# Patient Record
Sex: Female | Born: 1995 | Race: Black or African American | Hispanic: No | Marital: Single | Smoking: Never smoker
Health system: Southern US, Community
[De-identification: ages and names within clinical notes are randomized; demographics above are authoritative.]

## PROBLEM LIST (undated history)

## (undated) DIAGNOSIS — R011 Cardiac murmur, unspecified: Secondary | ICD-10-CM

## (undated) DIAGNOSIS — N051 Unspecified nephritic syndrome with focal and segmental glomerular lesions: Secondary | ICD-10-CM

## (undated) DIAGNOSIS — I10 Essential (primary) hypertension: Secondary | ICD-10-CM

## (undated) HISTORY — DX: Cardiac murmur, unspecified: R01.1

## (undated) HISTORY — DX: Essential (primary) hypertension: I10

---
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
## 2017-07-01 DIAGNOSIS — O26832 Pregnancy related renal disease, second trimester: Secondary | ICD-10-CM | POA: Insufficient documentation

## 2017-11-11 DIAGNOSIS — D649 Anemia, unspecified: Secondary | ICD-10-CM | POA: Insufficient documentation

## 2022-04-30 ENCOUNTER — Ambulatory Visit: Payer: Medicaid Other | Admitting: Nurse Practitioner

## 2022-04-30 ENCOUNTER — Encounter: Payer: Self-pay | Admitting: Nurse Practitioner

## 2022-04-30 DIAGNOSIS — I10 Essential (primary) hypertension: Secondary | ICD-10-CM | POA: Insufficient documentation

## 2022-04-30 DIAGNOSIS — Z113 Encounter for screening for infections with a predominantly sexual mode of transmission: Secondary | ICD-10-CM

## 2022-04-30 DIAGNOSIS — N760 Acute vaginitis: Secondary | ICD-10-CM

## 2022-04-30 DIAGNOSIS — Z3200 Encounter for pregnancy test, result unknown: Secondary | ICD-10-CM

## 2022-04-30 DIAGNOSIS — R011 Cardiac murmur, unspecified: Secondary | ICD-10-CM | POA: Insufficient documentation

## 2022-04-30 DIAGNOSIS — N051 Unspecified nephritic syndrome with focal and segmental glomerular lesions: Secondary | ICD-10-CM | POA: Insufficient documentation

## 2022-04-30 LAB — PREGNANCY, URINE: Preg Test, Ur: NEGATIVE

## 2022-04-30 LAB — WET PREP FOR TRICH, YEAST, CLUE
Trichomonas Exam: NEGATIVE
Yeast Exam: NEGATIVE

## 2022-04-30 LAB — HM HEPATITIS C SCREENING LAB: HM Hepatitis Screen: NEGATIVE

## 2022-04-30 LAB — HM HIV SCREENING LAB: HM HIV Screening: NEGATIVE

## 2022-04-30 LAB — HEPATITIS B SURFACE ANTIGEN: Hepatitis B Surface Ag: NONREACTIVE

## 2022-04-30 MED ORDER — METRONIDAZOLE 500 MG PO TABS: 500.0000 mg | ORAL_TABLET | Freq: Two times a day (BID) | ORAL | 0 refills | Status: AC

## 2022-04-30 NOTE — Progress Notes (Signed)
Patient here for STD screening. Wet prep reviewed, medication dispensed per providers orders.

## 2022-04-30 NOTE — Progress Notes (Addendum)
[facility] Department  STI clinic/screening visit [address] [place] [address] [phone]  Subjective:  Carolyn Kramer is a 26 y.o. female being seen today for an STI screening visit. The patient reports they do have symptoms.  Patient reports that they do not desire a pregnancy in the next year.   They reported they are not interested in discussing contraception today.    Patient's last menstrual period was 04/14/2022 (approximate).   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 Patient wants a pregnancy test     HPI  Patient reports to clinic for STD screening.  Patient reports lower abdominal pain on and off since May and odor for one week.  Patient states in April/May she was seen in [place] and reported some abdominal discomfort, patient was given medication but unsure of name.  Patient also reported that in June she was given medication for Trich.  Last HIV test per patient/review of record was 11/02/2017. Patient reports last pap was couple years ago.   Screening for MPX risk: Does the patient have an unexplained rash? No Is the patient MSM? No Does the patient endorse multiple sex partners or anonymous sex partners? No Did the patient have close or sexual contact with a person diagnosed with MPX? No Has the patient traveled outside the [place] where MPX is endemic? No Is there a high clinical suspicion for MPX-- evidenced by one of the following No  -Unlikely to be chickenpox  -Lymphadenopathy  -Rash that present in same phase of evolution on any given body part See flowsheet for further details and programmatic requirements.   Immunization  history:   There is no immunization history on file for this patient.   The following portions of the patient's history were reviewed and updated as appropriate: allergies, current medications, past medical history, past social history, past surgical history and problem list.  Objective:  There were no vitals filed for this visit.  Physical Exam Constitutional:      Appearance: Normal appearance.  HENT:     Head: Normocephalic. No abrasion, masses or laceration. Hair is normal.     Right Ear: External ear normal.     Left Ear: External ear normal.     Nose: Nose normal.     Mouth/Throat:     Mouth: No oral lesions.     Dentition: No dental caries.     Pharynx: No oropharyngeal exudate or posterior oropharyngeal erythema.     Tonsils: No tonsillar exudate or tonsillar abscesses.  Eyes:     General: Lids are normal.        Right eye: No discharge.        Left eye: No discharge.     Conjunctiva/sclera: Conjunctivae normal.     Right eye: No exudate.    Left eye: No exudate. Abdominal:     General: Abdomen is flat.     Palpations: Abdomen is soft.     Tenderness: There is abdominal tenderness. There is no rebound.     Comments: Lower abdominal tenderness   Genitourinary:    Pubic Area: No rash or pubic lice.      Labia:  Right: No rash, tenderness, lesion or injury.        Left: No rash, tenderness, lesion or injury.      Vagina: Normal. No vaginal discharge, erythema or lesions.     Cervix: No cervical motion tenderness, discharge, lesion or erythema.     Uterus: Not enlarged and not tender.      Rectum: Normal.     Comments: Amount Discharge: small  Odor: No pH: greater than 4.5 Adheres to vaginal wall: No Color: color of discharge matches the Modine Oppenheimer swab  Musculoskeletal:     Cervical back: Full passive range of motion without pain, normal range of motion and neck supple.  Lymphadenopathy:     Cervical: No cervical adenopathy.     Right cervical: No  superficial, deep or posterior cervical adenopathy.    Left cervical: No superficial, deep or posterior cervical adenopathy.     Upper Body:     Right upper body: No supraclavicular, axillary or epitrochlear adenopathy.     Left upper body: No supraclavicular, axillary or epitrochlear adenopathy.     Lower Body: No right inguinal adenopathy. No left inguinal adenopathy.  Skin:    General: Skin is warm and dry.     Findings: No lesion or rash.  Neurological:     Mental Status: She is alert and oriented to person, place, and time.  Psychiatric:        Attention and Perception: Attention normal.        Mood and Affect: Mood normal.        Speech: Speech normal.        Behavior: Behavior normal. Behavior is cooperative.      Assessment and Plan:  [name] is a 26 y.o. female presenting to the [facility] Department for STI screening   1. Screening examination for venereal disease -26 year old female in clinic today for STD screening. -Patient given Kathreen Cosier Card.   -Patient accepted all screenings including oral, vaginal, rectal CT/GC, wet prep, and bloodwork for HIV/RPR.  Patient meets criteria for HepB screening? Yes. Ordered? Yes Patient meets criteria for HepC screening? Yes. Ordered? Yes  Treat wet prep per standing order Discussed time line for [facility] results and that patient will be called with positive results and encouraged patient to call if she had not heard in 2 weeks.  Counseled to return or seek care for continued or worsening symptoms Recommended condom use with all sex  Patient is currently not using  contraception  to prevent pregnancy.    - HIV/HCV [place] [facility] - Syphilis Serology, [place] [facility] - HBV Antigen/Antibody [facility] - Chlamydia/Gonorrhea [place] [facility] - Chlamydia/Gonorrhea [place] [facility] - Chlamydia/Gonorrhea [place] [facility] - WET PREP FOR TRICH, YEAST, CLUE  2. Pregnancy examination or test, pregnancy unconfirmed -PT  negative today.  - Pregnancy, urine  3. Bacterial vaginosis -Wet prep reviewed, please treat patient for BV.  - metroNIDAZOLE (FLAGYL) 500 MG tablet; Take 1 tablet (500 mg total) by mouth 2 (two) times daily for 7 days.  Dispense: 14 tablet; Refill: 0   Total time spent: 30 minutes   Return if symptoms worsen or fail to improve.   Glenna Fellows, FNP

## 2022-06-04 ENCOUNTER — Ambulatory Visit: Payer: Medicaid Other

## 2022-06-11 ENCOUNTER — Ambulatory Visit
Admission: EM | Admit: 2022-06-11 | Discharge: 2022-06-11 | Disposition: A | Discharge status: Home / Self Care | Payer: Medicaid Other | Attending: Family Medicine | Admitting: Family Medicine

## 2022-06-11 ENCOUNTER — Encounter: Payer: Self-pay | Admitting: Emergency Medicine

## 2022-06-11 DIAGNOSIS — B9689 Other specified bacterial agents as the cause of diseases classified elsewhere: Secondary | ICD-10-CM | POA: Insufficient documentation

## 2022-06-11 DIAGNOSIS — N760 Acute vaginitis: Secondary | ICD-10-CM | POA: Insufficient documentation

## 2022-06-11 MED ORDER — METRONIDAZOLE 500 MG PO TABS: 500.0000 mg | ORAL_TABLET | Freq: Two times a day (BID) | ORAL | 0 refills | Status: DC

## 2022-06-11 NOTE — ED Triage Notes (Signed)
Vaginal odor and itching x 1 week.

## 2022-06-11 NOTE — ED Provider Notes (Signed)
[facility]    CSN: [id number] Arrival date & time: 06/11/22  1707      History   Chief Complaint No chief complaint on file.   HPI Carolyn Kramer is a 26 y.o. female.   Patient presenting today with about a month of vaginal odor, discharge.  Had some itching initially as well but that seems to have resolved.  Was diagnosed with BV about a month ago, states she was given metronidazole but she was not taking it appropriately and missed numerous doses.  Does not feel like it ever cleared up.  Denies any new sexual partners, abdominal pain, urinary symptoms, rashes or lesions.  Not trying anything over-the-counter for symptoms.    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      Home Medications    Prior to Admission medications   Medication Sig Start Date End Date Taking? Authorizing Provider  metroNIDAZOLE (FLAGYL) 500 MG tablet Take 1 tablet (500 mg total) by mouth 2 (two) times daily. 06/11/22  Yes Particia Nearing, PA-C    Family History History reviewed. No pertinent family history.  Social History Social History   Tobacco Use   Smoking status: Never   Smokeless tobacco: Never  Vaping Use   Vaping Use: Never used  Substance Use Topics   Alcohol use: Yes    Comment: occassionally   Drug use: Yes    Types: Marijuana    Comment: daily     Allergies   Patient has no known allergies.   Review of Systems Review of Systems Per HPI  Physical Exam Triage Vital Signs ED Triage Vitals  Enc Vitals Group     BP 06/11/22 1733 127/79     Pulse Rate 06/11/22 1733 76      Resp 06/11/22 1733 18     Temp 06/11/22 1733 98.3 F (36.8 C)     Temp Source 06/11/22 1733 Oral     SpO2 06/11/22 1733 98 %     Weight --      Height --      Head Circumference --      Peak Flow --      Pain Score 06/11/22 1734 0     Pain Loc --      Pain Edu? --      Excl. in GC? --    No data found.  Updated Vital Signs BP 127/79 (BP Location: Right Arm)   Pulse 76   Temp 98.3 F (36.8 C) (Oral)   Resp 18   LMP 05/28/2022 (Approximate)   SpO2 98%   Visual Acuity Right Eye Distance:   Left Eye Distance:   Bilateral Distance:    Right Eye Near:   Left Eye Near:    Bilateral Near:     Physical Exam Vitals and nursing note reviewed.  Constitutional:      Appearance: Normal appearance. She is not ill-appearing.  HENT:     Head: Atraumatic.  Eyes:     Extraocular Movements: Extraocular movements intact.     Conjunctiva/sclera: Conjunctivae normal.  Cardiovascular:  Rate and Rhythm: Normal rate and regular rhythm.     Heart sounds: Normal heart sounds.  Pulmonary:     Effort: Pulmonary effort is normal.     Breath sounds: Normal breath sounds.  Genitourinary:    Comments: GU exam deferred, self swab performed Musculoskeletal:        General: Normal range of motion.     Cervical back: Normal range of motion and neck supple.  Skin:    General: Skin is warm and dry.  Neurological:     Mental Status: She is alert and oriented to person, place, and time.  Psychiatric:        Mood and Affect: Mood normal.        Thought Content: Thought content normal.        Judgment: Judgment normal.      UC Treatments / Results  Labs (all labs ordered are listed, but only abnormal results are displayed) Labs Reviewed  CERVICOVAGINAL ANCILLARY ONLY    EKG   Radiology No results found.  Procedures Procedures (including critical care time)  Medications Ordered in UC Medications - No data to display  Initial Impression / Assessment and Plan / UC Course  I  have reviewed the triage vital signs and the nursing notes.  Pertinent labs & imaging results that were available during my care of the patient were reviewed by me and considered in my medical decision making (see chart for details).     We will prescribe another round of metronidazole and discussed consistency with taking the medication.  Also discussed some supportive over-the-counter medications and remedies to help support a healthy pH.  Follow-up with OB/GYN for unresolving symptoms.  Vaginal swab pending, adjust if needed.  Final Clinical Impressions(s) / UC Diagnoses   Final diagnoses:  BV (bacterial vaginosis)     Discharge Instructions      You may try boric acid vaginal suppositories, vaginal health probiotics to help support a healthy vaginal flora.  Avoid any scented soaps, feminine washes or wipes, douching as these things may throw off your pH    ED Prescriptions     Medication Sig Dispense Auth. Provider   metroNIDAZOLE (FLAGYL) 500 MG tablet Take 1 tablet (500 mg total) by mouth 2 (two) times daily. 14 tablet Particia Nearing, [place]      PDMP not reviewed this encounter.   Particia Nearing, [place] 06/11/22 1759

## 2022-06-11 NOTE — Discharge Instructions (Signed)
You may try boric acid vaginal suppositories, vaginal health probiotics to help support a healthy vaginal flora.  Avoid any scented soaps, feminine washes or wipes, douching as these things may throw off your pH

## 2022-06-13 LAB — CERVICOVAGINAL ANCILLARY ONLY
Bacterial Vaginitis (gardnerella): POSITIVE — AB
Candida Glabrata: NEGATIVE
Candida Vaginitis: NEGATIVE
Chlamydia: NEGATIVE
Comment: NEGATIVE
Comment: NEGATIVE
Comment: NEGATIVE
Comment: NEGATIVE
Comment: NEGATIVE
Comment: NORMAL
Neisseria Gonorrhea: NEGATIVE
Trichomonas: NEGATIVE

## 2022-06-19 ENCOUNTER — Ambulatory Visit: Payer: Medicaid Other

## 2022-12-10 ENCOUNTER — Emergency Department (HOSPITAL_BASED_OUTPATIENT_CLINIC_OR_DEPARTMENT_OTHER): Payer: Medicaid Other

## 2022-12-10 ENCOUNTER — Other Ambulatory Visit: Payer: Self-pay

## 2022-12-10 ENCOUNTER — Emergency Department (HOSPITAL_COMMUNITY): Payer: Medicaid Other

## 2022-12-10 ENCOUNTER — Encounter (HOSPITAL_BASED_OUTPATIENT_CLINIC_OR_DEPARTMENT_OTHER): Payer: Self-pay | Admitting: Emergency Medicine

## 2022-12-10 ENCOUNTER — Emergency Department (HOSPITAL_BASED_OUTPATIENT_CLINIC_OR_DEPARTMENT_OTHER)
Admission: EM | Admit: 2022-12-10 | Discharge: 2022-12-11 | Disposition: A | Discharge status: Home / Self Care | Payer: Medicaid Other | Attending: Emergency Medicine | Admitting: Emergency Medicine

## 2022-12-10 DIAGNOSIS — M542 Cervicalgia: Secondary | ICD-10-CM | POA: Insufficient documentation

## 2022-12-10 DIAGNOSIS — I10 Essential (primary) hypertension: Secondary | ICD-10-CM | POA: Diagnosis not present

## 2022-12-10 DIAGNOSIS — R531 Weakness: Secondary | ICD-10-CM | POA: Diagnosis not present

## 2022-12-10 DIAGNOSIS — R202 Paresthesia of skin: Secondary | ICD-10-CM | POA: Diagnosis not present

## 2022-12-10 DIAGNOSIS — R200 Anesthesia of skin: Secondary | ICD-10-CM | POA: Diagnosis present

## 2022-12-10 LAB — CBC
HCT: 36.3 % (ref 36.0–46.0)
Hemoglobin: 11.9 g/dL — ABNORMAL LOW (ref 12.0–15.0)
MCH: 27.6 pg (ref 26.0–34.0)
MCHC: 32.8 g/dL (ref 30.0–36.0)
MCV: 84.2 fL (ref 80.0–100.0)
Platelets: 396 10*3/uL (ref 150–400)
RBC: 4.31 MIL/uL (ref 3.87–5.11)
RDW: 14.6 % (ref 11.5–15.5)
WBC: 5.7 10*3/uL (ref 4.0–10.5)
nRBC: 0 % (ref 0.0–0.2)

## 2022-12-10 LAB — BASIC METABOLIC PANEL
Anion gap: 7 (ref 5–15)
BUN: 17 mg/dL (ref 6–20)
CO2: 26 mmol/L (ref 22–32)
Calcium: 9.6 mg/dL (ref 8.9–10.3)
Chloride: 105 mmol/L (ref 98–111)
Creatinine, Ser: 0.85 mg/dL (ref 0.44–1.00)
GFR, Estimated: >60 mL/min (ref >60)
Glucose, Bld: 86 mg/dL (ref 70–99)
Potassium: 3.8 mmol/L (ref 3.5–5.1)
Sodium: 138 mmol/L (ref 135–145)

## 2022-12-10 LAB — PREGNANCY, URINE: Preg Test, Ur: NEGATIVE

## 2022-12-10 MED ORDER — GADOBUTROL 1 MMOL/ML IV SOLN
7.5000 mL | Freq: Once | INTRAVENOUS | Status: AC | PRN
  Administered 2022-12-10: 7.5 mL via INTRAVENOUS

## 2022-12-10 NOTE — ED Provider Notes (Signed)
[facility] Provider Note   CSN: [id number] Arrival date & time: 12/10/22  1344     History  Chief Complaint  Patient presents with   Extremity Weakness    Elynore Micheal Likens Parks is a 27 y.o. female.   Extremity Weakness  Patient presents with left arm numbness.  Has had some pain in the past in the forearm but now numb and weakness.  States that began earlier today.  Also some neck pain.  Works at [employer].  No trauma.  No headache.  No confusion.  States her sister has MS.  Arm does not feel cold.  No chest pain.  States it feels like she slept on it    Past Medical History:  Diagnosis Date   FSGS (focal segmental glomerulosclerosis)    Heart murmur    Hypertension     Home Medications Prior to Admission medications   Medication Sig Start Date End Date Taking? Authorizing Provider  metroNIDAZOLE (FLAGYL) 500 MG tablet Take 1 tablet (500 mg total) by mouth 2 (two) times daily. 06/11/22   Volney American, PA-C      Allergies    Patient has no known allergies.    Review of Systems   Review of Systems  Musculoskeletal:  Positive for extremity weakness.    Physical Exam Updated Vital Signs BP 129/88 (BP Location: Right Arm)   Pulse 65   Temp 98.3 F (36.8 C)   Resp 18   Ht '5\' 6"'$  (1.676 m)   Wt 81.6 kg   LMP 12/01/2022 (Approximate)   SpO2 100%   BMI 29.05 kg/m  Physical Exam Vitals and nursing note reviewed.  Eyes:     Pupils: Pupils are equal, round, and reactive to light.  Cardiovascular:     Rate and Rhythm: Regular rhythm.  Abdominal:     Tenderness: There is no abdominal tenderness.  Neurological:     Mental Status: She is alert.     Comments: Strength intact bilateral upper extremities.  Good grip strength.  Paresthesias to left forearm down to hand.  Over glove distribution.  Pulse intact.     ED Results / Procedures / Treatments   Labs (all labs ordered are listed, but only abnormal results  are displayed) Labs Reviewed  BASIC METABOLIC PANEL  CBC  PREGNANCY, URINE    EKG None  Radiology No results found.  Procedures Procedures    Medications Ordered in ED Medications - No data to display  ED Course/ Medical Decision Making/ A&P                             Medical Decision Making Amount and/or Complexity of Data Reviewed Labs: ordered. Radiology: ordered.   Patient with paresthesias left forearm.  States starting today however had numbness and decreased use of it.  Walking down the hall was carrying the paperwork in her right hand instead of her left hand and she is left-handed.  Has had left forearm pain.  Potentially could be an overuse injury, however discussed with Dr. Quinn Axe from neurology.  Recommends MRI with and without contrast of head and neck to rule out MS.  Will get CT of the cervical spine here first.  Will check basic blood work.  Care turned over to Dr. Pearline Cables.          Final Clinical Impression(s) / ED Diagnoses Final diagnoses:  Paresthesia    Rx / DC  Orders ED Discharge Orders     None         Davonna Belling, MD 12/10/22 1512

## 2022-12-10 NOTE — ED Triage Notes (Signed)
Pt via pov from home with left arm numbness today. She reports her arm felt normal until 0930, when she began to feel pressure and then numbness/tingling. Pt has full ROM with the arm. She states that this has been going on for some time, but she isn't exactly sure how long. Pt states the pressure/pain has been for about 2 years, but the numbness is "fairly new." Pt has some drift in her right arm as well as reduced sensation.  Pt alert & oriented, nad noted.

## 2022-12-10 NOTE — ED Provider Notes (Signed)
  Provider Note MRN:  [id number]  Arrival date & time: 12/10/22    ED Course and Medical Decision Making  Assumed care from Dr Alvino Chapel at shift change.  See note from prior team for complete details, in brief:   27 yo f, plan [facility].  Family hx MS Left forearm pain/neck pain.  Numbness/paresthesias to forearm.  Labs stable CT c-spine was stable Prior team spoke with Dr Quinn Axe > recommend MRI head/neck w/wo; if neg o/p neuro fu  Will send over to [facility] ED for MRI brain/c-spine  Accepting Dr Oswald Hillock at [facility] ED   Procedures  Final Clinical Impressions(s) / ED Diagnoses     ICD-10-CM   1. Paresthesia  R20.2       ED Discharge Orders     None       Discharge Instructions   None        Oluwatosin, Bracy, DO 12/10/22 1639

## 2022-12-10 NOTE — ED Notes (Signed)
Report given to the Charge RN at [facility].Marland KitchenMarland Kitchen

## 2022-12-10 NOTE — ED Notes (Signed)
Pt released to drive POV, Provider authorized, to [facility] for a MRI... Pt explain on where and how to arrive at [facility]... IV secured.Marland KitchenMarland Kitchen

## 2022-12-10 NOTE — Discharge Instructions (Signed)
It was a pleasure caring for you today in the emergency department. ° °Please return to the emergency department for any worsening or worrisome symptoms. ° ° °

## 2022-12-10 NOTE — ED Notes (Signed)
Pt CAOx4, no new symptoms... No change in complaint.Marland KitchenMarland Kitchen

## 2022-12-10 NOTE — ED Notes (Signed)
Pt informed this Probation officer she was sent over from [place] for a MRI.  This Probation officer confirmed the MRIs have been ordered.  Pt informed they will be able to take her to MRI from lobby.

## 2022-12-11 NOTE — ED Provider Notes (Signed)
Patient transferred for MRI.  Patient with paresthesias and family history of MS.  She arrived to [facility] to have MRI of brain and cervical spine with and without contrast.  Imaging has been performed.  Nonspecific findings present, no evidence of demyelinating disease or other acute pathology to explain patient's symptoms.  As per plan, discharge with neurology outpatient follow-up.   Orpah Greek, MD 12/11/22 [phone]

## 2023-02-04 ENCOUNTER — Ambulatory Visit: Payer: Medicaid Other | Admitting: Advanced Practice Midwife

## 2023-02-04 ENCOUNTER — Encounter: Payer: Self-pay | Admitting: Advanced Practice Midwife

## 2023-02-04 DIAGNOSIS — Z62810 Personal history of physical and sexual abuse in childhood: Secondary | ICD-10-CM

## 2023-02-04 DIAGNOSIS — F1290 Cannabis use, unspecified, uncomplicated: Secondary | ICD-10-CM

## 2023-02-04 DIAGNOSIS — Z113 Encounter for screening for infections with a predominantly sexual mode of transmission: Secondary | ICD-10-CM | POA: Diagnosis not present

## 2023-02-04 DIAGNOSIS — F4310 Post-traumatic stress disorder, unspecified: Secondary | ICD-10-CM

## 2023-02-04 LAB — HM HIV SCREENING LAB: HM HIV Screening: NEGATIVE

## 2023-02-04 LAB — WET PREP FOR TRICH, YEAST, CLUE
Trichomonas Exam: NEGATIVE
Yeast Exam: NEGATIVE

## 2023-02-04 LAB — HM HEPATITIS C SCREENING LAB: HM Hepatitis Screen: NEGATIVE

## 2023-02-04 NOTE — Progress Notes (Signed)
Reviewed in house labs during visit. BTHIELE RN

## 2023-02-04 NOTE — Progress Notes (Signed)
[facility]  STI clinic/screening visit [address] [place] [address] [phone]  Subjective:  Carolyn Kramer is a 27 y.o. SBF exvaper G1P1  female being seen today for an STI screening visit. The patient reports they do not have symptoms.  Patient reports that they do not desire a pregnancy in the next year.   They reported they are not interested in discussing contraception today.    Patient's last menstrual period was 01/19/2023 (approximate).  Patient has the following medical conditions:   Patient Active Problem List   Diagnosis Date Noted   Morbid obesity (HCC) 180 lbs 02/04/2023   Marijuana use 02/04/2023   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   Hypertension 04/30/2022   Heart murmur 04/30/2022    Chief Complaint  Patient presents with   STI Visit    HPI  Patient reports asymptomatic. LMP 01/26/23. Last sex 01/30/23 without condom; first time with this partner; 1 partner in last 3 mo. Last MJ today. Last vaped 2022. Last cigar age 10. Last ETOH 12/11/22 (7 shots Tequila). Last pap 2023 wnl  Does the patient using douching products? No  Last HIV test per patient/review of record was  Lab Results  Component Value Date   HMHIVSCREEN Negative - Validated 04/30/2022   No results found for: "HIV" Patient reports last pap was No results found for: "DIAGPAP" No results found for: "SPECADGYN"  Screening for MPX risk: Does the patient have an unexplained rash? No Is the patient MSM? No Does the patient endorse multiple sex partners or anonymous sex partners? No Did the patient have close or sexual contact with a person diagnosed with MPX? No Has the patient traveled outside the [place] where MPX is endemic? No Is there a high clinical suspicion for MPX-- evidenced by one of the following No  -Unlikely to be chickenpox  -Lymphadenopathy  -Rash that present in same phase of evolution on any given body part See  flowsheet for further details and programmatic requirements.   Immunization history:  Immunization History  Administered Date(s) Administered   HPV 9-valent 06/21/2015, 05/14/2016   Hep B, Unspecified [birth year] 1998, 12/05/1997, 12/04/1999   MMR 12/04/1999, 05/05/2003   Meningococcal Conjugate 06/21/2015   Tdap 06/20/2009, 11/02/2017     The following portions of the patient's history were reviewed and updated as appropriate: allergies, current medications, past medical history, past social history, past surgical history and problem list.  Objective:  There were no vitals filed for this visit.  Physical Exam Vitals and nursing note reviewed.  Constitutional:      Appearance: Normal appearance. She is obese.  HENT:     Head: Normocephalic and atraumatic.     Mouth/Throat:     Mouth: Mucous membranes are moist.     Pharynx: Oropharynx is clear. No oropharyngeal exudate or posterior oropharyngeal erythema.  Eyes:     Conjunctiva/sclera: Conjunctivae normal.  Pulmonary:     Effort: Pulmonary effort is normal.  Abdominal:     Palpations: Abdomen is soft. There is no mass.     Tenderness: There is no abdominal tenderness. There is no rebound.     Comments: Soft without masses or tenderness  Genitourinary:    General: Normal vulva.     Exam position: Lithotomy position.     Pubic Area: No rash or pubic lice.      Labia:        Right: No rash or lesion.        Left: No  rash or lesion.      Vagina: Vaginal discharge (white creamy leukorrhea, ph<4.5) present. No erythema, bleeding or lesions.     Cervix: Normal.     Uterus: Normal.      Adnexa: Right adnexa normal and left adnexa normal.     Rectum: Normal.     Comments: pH = <4.5 Lymphadenopathy:     Head:     Right side of head: No preauricular or posterior auricular adenopathy.     Left side of head: No preauricular or posterior auricular adenopathy.     Cervical: No cervical adenopathy.     Right cervical: No superficial,  deep or posterior cervical adenopathy.    Left cervical: No superficial, deep or posterior cervical adenopathy.     Upper Body:     Right upper body: No supraclavicular, axillary or epitrochlear adenopathy.     Left upper body: No supraclavicular, axillary or epitrochlear adenopathy.     Lower Body: No right inguinal adenopathy. No left inguinal adenopathy.  Skin:    General: Skin is warm and dry.     Findings: No rash.  Neurological:     Mental Status: She is alert and oriented to person, place, and time.      Assessment and Plan:  Carolyn Kramer is a 27 y.o. female presenting to the [facility] Department for STI screening  1. Screening examination for venereal disease Treat wet mount per standing orders Immunization nurse consult  - WET PREP FOR TRICH, YEAST, CLUE - Syphilis Serology, [facility] - HIV/HCV [facility] - Chlamydia/Gonorrhea [facility] - Gonococcus culture  2. Morbid obesity (HCC) 180 lbs   3. Marijuana use    Patient accepted all screenings including oral, vaginal CT/GC and bloodwork for HIV/RPR, and wet prep. Patient meets criteria for HepB screening? Yes. Ordered? no Patient meets criteria for HepC screening? Yes. Ordered? yes  Treat wet prep per standing order Discussed time line for [facility] results and that patient will be called with positive results and encouraged patient to call if she had not heard in 2 weeks.  Counseled to return or seek care for continued or worsening symptoms Recommended repeat testing in 3 months with positive results. Recommended condom use with all sex  Patient is currently using  nothing  to prevent pregnancy.    Return if symptoms worsen or fail to improve.  Future Appointments  Date Time Provider Department Center  02/05/2023  2:00 PM Suanne Marker, MD [facility] None  02/27/2023 10:00 AM Kara Dies, NP [id number]    Carolyn Kramer, Carolyn Kramer

## 2023-02-05 ENCOUNTER — Encounter: Payer: Self-pay | Admitting: Diagnostic Neuroimaging

## 2023-02-05 ENCOUNTER — Ambulatory Visit (INDEPENDENT_AMBULATORY_CARE_PROVIDER_SITE_OTHER): Payer: Medicaid Other | Admitting: Diagnostic Neuroimaging

## 2023-02-05 VITALS — BP 112/66 | HR 94 | Ht 65.0 in | Wt 185.0 lb

## 2023-02-05 DIAGNOSIS — M79602 Pain in left arm: Secondary | ICD-10-CM

## 2023-02-05 DIAGNOSIS — M542 Cervicalgia: Secondary | ICD-10-CM | POA: Diagnosis not present

## 2023-02-05 NOTE — Patient Instructions (Signed)
  PAIN LEFT ARM (since ~2021; mild fluctuating numbness in left arm since March 2024; neuro exam normal; likely musculoskeletal strain) - refer to PT for conservative mgmt

## 2023-02-05 NOTE — Progress Notes (Signed)
[facility]  PATIENT: Carolyn Kramer DOB: 1996-01-05  REFERRING CLINICIAN: Gilda Crease,* HISTORY FROM: patient  REASON FOR VISIT: new consult   HISTORICAL  CHIEF COMPLAINT:  Chief Complaint  Patient presents with   Numbness    Rm 6 alone  Pt is well, reports she has been having pens and needles feeling in L arm since 12/10/22. She previously was having pain and numbness since 2021.  She is also having less mobility in L arm than she has in R.     HISTORY OF PRESENT ILLNESS:   27 year old female here for evaluation of numbness and tingling and pain.  Left upper extremity pain since 2021 radiating from left neck and shoulder to the left arm.  In March 2024 had more numbness and tingling sensation in left arm.  She was emergency room for evaluation.  MRI of the brain and cervical spine obtained which showed no acute findings or any explanation for symptoms.  Since that time symptoms have slightly improved.  No problems with right arm.  No problems with legs.  No facial numbness.  No vision changes speech or swallowing issues.   REVIEW OF SYSTEMS: Full 14 system review of systems performed and negative with exception of: as per HPI.  ALLERGIES: No Known Allergies  HOME MEDICATIONS: No outpatient medications prior to visit.   No facility-administered medications prior to visit.    PAST MEDICAL HISTORY: Past Medical History:  Diagnosis Date   FSGS (focal segmental glomerulosclerosis)    Heart murmur    Hypertension     PAST SURGICAL HISTORY: Past Surgical History:  Procedure Laterality Date   Kidney biopsy   2013    FAMILY HISTORY: History reviewed. No pertinent family history.  SOCIAL HISTORY: Social History   Socioeconomic History   Marital status: Single    Spouse name: Not on file   Number of children: Not on file   Years of education: Not on file   Highest education level: Not on file  Occupational History   Not on  file  Tobacco Use   Smoking status: Some Days    Types: E-cigarettes   Smokeless tobacco: Never  Vaping Use   Vaping Use: Never used  Substance and Sexual Activity   Alcohol use: Yes    Alcohol/week: 7.0 standard drinks of alcohol    Types: 7 Shots of liquor per week    Comment: last use 12/11/22 qomonth   Drug use: Yes    Types: Marijuana    Comment: daily   Sexual activity: Yes    Partners: Male    Birth control/protection: Condom  Other Topics Concern   Not on file  Social History Narrative   Not on file   Social Determinants of Health   Financial Resource Strain: Not on file  Food Insecurity: Not on file  Transportation Needs: Not on file  Physical Activity: Not on file  Stress: Not on file  Social Connections: Not on file  Intimate Partner Violence: Not At Risk (04/30/2022)   Humiliation, Afraid, Rape, and Kick questionnaire    Fear of Current or Ex-Partner: No    Emotionally Abused: No    Physically Abused: No    Sexually Abused: No     PHYSICAL EXAM  GENERAL EXAM/CONSTITUTIONAL: Vitals:  Vitals:   02/05/23 1349  BP: 112/66  Pulse: 94  Weight: 185 lb (83.9 kg)  Height: 5\' 5"  (1.651 m)   Body mass index is 30.79 kg/m. Wt Readings from Last  3 Encounters:  02/05/23 185 lb (83.9 kg)  12/10/22 180 lb (81.6 kg)  02/26/16 163 lb (73.9 kg) (91 %, Z= 1.32)*   * Growth percentiles are based on CDC (Girls, 2-20 Years) data.   Patient is in no distress; well developed, nourished and groomed; neck is supple  CARDIOVASCULAR: Examination of carotid arteries is normal; no carotid bruits Regular rate and rhythm, no murmurs Examination of peripheral vascular system by observation and palpation is normal  EYES: Ophthalmoscopic exam of optic discs and posterior segments is normal; no papilledema or hemorrhages No results found.  MUSCULOSKELETAL: Gait, strength, tone, movements noted in Neurologic exam below  NEUROLOGIC: MENTAL STATUS:      No data to  display         awake, alert, oriented to person, place and time recent and remote memory intact normal attention and concentration language fluent, comprehension intact, naming intact fund of knowledge appropriate  CRANIAL NERVE:  2nd - no papilledema on fundoscopic exam 2nd, 3rd, 4th, 6th - pupils equal and reactive to light, visual fields full to confrontation, extraocular muscles intact, no nystagmus 5th - facial sensation symmetric 7th - facial strength symmetric 8th - hearing intact 9th - palate elevates symmetrically, uvula midline 11th - shoulder shrug symmetric 12th - tongue protrusion midline  MOTOR:  normal bulk and tone, full strength in the BUE, BLE  SENSORY:  normal and symmetric to light touch, temperature, vibration  COORDINATION:  finger-nose-finger, fine finger movements normal  REFLEXES:  deep tendon reflexes 1+ and symmetric  GAIT/STATION:  narrow based gait     DIAGNOSTIC DATA (LABS, IMAGING, TESTING) - I reviewed patient records, labs, notes, testing and imaging myself where available.  Lab Results  Component Value Date   WBC 5.7 12/10/2022   HGB 11.9 (L) 12/10/2022   HCT 36.3 12/10/2022   MCV 84.2 12/10/2022   PLT 396 12/10/2022      Component Value Date/Time   NA 138 12/10/2022 1530   NA 142 (H) 06/02/2014 0032   K 3.8 12/10/2022 1530   K 3.8 06/02/2014 0032   CL 105 12/10/2022 1530   CL 106 06/02/2014 0032   CO2 26 12/10/2022 1530   CO2 27 (H) 06/02/2014 0032   GLUCOSE 86 12/10/2022 1530   GLUCOSE 102 (H) 06/02/2014 0032   BUN 17 12/10/2022 1530   BUN 12 06/02/2014 0032   CREATININE 0.85 12/10/2022 1530   CREATININE 0.93 06/02/2014 0032   CALCIUM 9.6 12/10/2022 1530   CALCIUM 9.2 06/02/2014 0032   PROT 7.7 02/26/2016 2254   PROT 8.2 06/02/2014 0032   ALBUMIN 4.0 02/26/2016 2254   ALBUMIN 3.9 06/02/2014 0032   AST 18 02/26/2016 2254   AST 34 (H) 06/02/2014 0032   ALT 10 (L) 02/26/2016 2254   ALT 18 06/02/2014 0032    ALKPHOS 54 02/26/2016 2254   ALKPHOS 73 06/02/2014 0032   BILITOT 0.1 (L) 02/26/2016 2254   BILITOT 0.4 06/02/2014 0032   GFRNONAA >60 12/10/2022 1530   GFRAA >60 02/26/2016 2254   No results found for: "CHOL", "HDL", "LDLCALC", "LDLDIRECT", "TRIG", "CHOLHDL" No results found for: "HGBA1C" No results found for: "VITAMINB12" No results found for: "TSH"   12/10/22 MRI brain / cervical spine (with and without) [I reviewed images myself and agree with interpretation. -VRP]  1. Bilateral, frontal predominant, multifocal hyperintense T2-weighted signal in the white matter, as may be seen in patients with migraines, but is also commonly present in asymptomatic patients. 2. Otherwise normal brain and  cervical spine.    ASSESSMENT AND PLAN  27 y.o. year old female here with:   Dx:  1. Neck pain   2. Pain of left upper extremity      PLAN:  PAIN LEFT ARM (since ~2021; mild fluctuating numbness in left arm since March 2024; neuro exam normal; likely musculoskeletal strain) - refer to PT for conservative mgmt - consider sports medicine evaluation or EMG/NCS if symptoms worsen or fail to improve  Orders Placed This Encounter  Procedures   Ambulatory referral to Physical Therapy   Return for return to PCP, pending if symptoms worsen or fail to improve.    Suanne Marker, MD 02/05/2023, 2:17 PM Certified in Neurology, Neurophysiology and Neuroimaging  [facility] [address], [address] [place], [place] [address] [phone]

## 2023-02-08 LAB — GONOCOCCUS CULTURE

## 2023-02-27 ENCOUNTER — Ambulatory Visit: Payer: Medicaid Other | Admitting: Nurse Practitioner

## 2023-03-13 ENCOUNTER — Ambulatory Visit: Payer: Medicaid Other

## 2023-03-16 ENCOUNTER — Ambulatory Visit: Payer: Medicaid Other | Attending: Diagnostic Neuroimaging

## 2023-03-16 DIAGNOSIS — M79602 Pain in left arm: Secondary | ICD-10-CM | POA: Insufficient documentation

## 2023-03-16 DIAGNOSIS — R293 Abnormal posture: Secondary | ICD-10-CM | POA: Insufficient documentation

## 2023-03-16 DIAGNOSIS — M542 Cervicalgia: Secondary | ICD-10-CM | POA: Diagnosis present

## 2023-03-16 DIAGNOSIS — R202 Paresthesia of skin: Secondary | ICD-10-CM | POA: Diagnosis present
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
## 2023-03-18 ENCOUNTER — Ambulatory Visit: Payer: Medicaid Other

## 2023-03-18 DIAGNOSIS — M542 Cervicalgia: Secondary | ICD-10-CM | POA: Diagnosis not present

## 2023-03-18 DIAGNOSIS — R202 Paresthesia of skin: Secondary | ICD-10-CM

## 2023-03-18 DIAGNOSIS — R293 Abnormal posture: Secondary | ICD-10-CM
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
## 2023-03-25 ENCOUNTER — Ambulatory Visit: Payer: Medicaid Other

## 2023-03-25 DIAGNOSIS — M542 Cervicalgia: Secondary | ICD-10-CM

## 2023-03-25 DIAGNOSIS — R293 Abnormal posture: Secondary | ICD-10-CM

## 2023-03-25 DIAGNOSIS — R202 Paresthesia of skin: Secondary | ICD-10-CM
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
## 2023-04-15 ENCOUNTER — Ambulatory Visit: Payer: Medicaid Other | Attending: Diagnostic Neuroimaging

## 2023-04-15 DIAGNOSIS — M542 Cervicalgia: Secondary | ICD-10-CM | POA: Insufficient documentation

## 2023-04-15 DIAGNOSIS — R202 Paresthesia of skin: Secondary | ICD-10-CM | POA: Insufficient documentation

## 2023-04-15 DIAGNOSIS — R293 Abnormal posture: Secondary | ICD-10-CM | POA: Diagnosis present
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
## 2023-04-22 ENCOUNTER — Ambulatory Visit: Payer: Medicaid Other

## 2023-04-22 ENCOUNTER — Telehealth: Payer: Self-pay

## 2023-04-22 NOTE — Telephone Encounter (Signed)
Called patient about missed PT appointment this morning but no answer. LVM regarding time and date of next appointment.

## 2023-04-24 ENCOUNTER — Ambulatory Visit: Payer: Medicaid Other

## 2023-04-24 DIAGNOSIS — M542 Cervicalgia: Secondary | ICD-10-CM

## 2023-04-24 DIAGNOSIS — R202 Paresthesia of skin: Secondary | ICD-10-CM

## 2023-04-24 DIAGNOSIS — R293 Abnormal posture: Secondary | ICD-10-CM
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
## 2023-04-29 ENCOUNTER — Ambulatory Visit: Payer: Medicaid Other

## 2023-05-01 ENCOUNTER — Ambulatory Visit: Payer: Medicaid Other | Attending: Diagnostic Neuroimaging

## 2023-05-01 DIAGNOSIS — M542 Cervicalgia: Secondary | ICD-10-CM | POA: Diagnosis present

## 2023-05-01 DIAGNOSIS — R202 Paresthesia of skin: Secondary | ICD-10-CM | POA: Insufficient documentation

## 2023-05-01 DIAGNOSIS — R293 Abnormal posture: Secondary | ICD-10-CM | POA: Diagnosis present
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
## 2023-05-04 ENCOUNTER — Ambulatory Visit: Payer: Medicaid Other

## 2023-05-04 DIAGNOSIS — M542 Cervicalgia: Secondary | ICD-10-CM

## 2023-05-04 DIAGNOSIS — R202 Paresthesia of skin: Secondary | ICD-10-CM

## 2023-05-04 DIAGNOSIS — R293 Abnormal posture: Secondary | ICD-10-CM
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
## 2023-05-07 ENCOUNTER — Ambulatory Visit: Payer: Medicaid Other

## 2023-05-13 ENCOUNTER — Encounter: Payer: Self-pay | Admitting: Family Medicine

## 2023-05-13 ENCOUNTER — Ambulatory Visit (INDEPENDENT_AMBULATORY_CARE_PROVIDER_SITE_OTHER): Payer: Medicaid Other | Admitting: Family Medicine

## 2023-05-13 VITALS — BP 108/70 | HR 79 | Temp 98.2°F | Resp 18 | Ht 66.0 in | Wt 185.5 lb

## 2023-05-13 DIAGNOSIS — Z7689 Persons encountering health services in other specified circumstances: Secondary | ICD-10-CM | POA: Diagnosis not present

## 2023-05-13 DIAGNOSIS — N912 Amenorrhea, unspecified: Secondary | ICD-10-CM | POA: Diagnosis not present

## 2023-05-13 DIAGNOSIS — Z331 Pregnant state, incidental: Secondary | ICD-10-CM

## 2023-05-13 LAB — POCT URINE PREGNANCY: Preg Test, Ur: POSITIVE — AB

## 2023-05-13 NOTE — Progress Notes (Signed)
New Patient Office Visit  Subjective    Patient ID: Carolyn Kramer, female    DOB: January 11, 1996  Age: 27 y.o. MRN: [id number]  CC:  Chief Complaint  Patient presents with   Establish Care    Patient is here to establish care with a new PCP, she states that her last LMP was 04/16/23, Patient states that she took a home pregnancy test and the test was positive    HPI Carolyn Kramer presents to establish care. Pt is new to me.   She is here for confirmation of pregnancy. Here with potential father. She reports LMP was 7/18 and was regular. She says she took a home pregnancy test on Sunday and it was positive.  She has 1 daughter age 48. She has hx of preeclampsia and delivered in [place]. She would like to have her referral sent to [facility] for [facility] care.          No outpatient encounter medications on file as of 05/13/2023.   No facility-administered encounter medications on file as of 05/13/2023.    Past Medical History:  Diagnosis Date   FSGS (focal segmental glomerulosclerosis)    Heart murmur    Hypertension     Past Surgical History:  Procedure Laterality Date   Kidney biopsy   2013    Family History  Problem Relation Age of Onset   Cancer Mother        remission   Diabetes Father     Social History   Socioeconomic History   Marital status: Single    Spouse name: Not on file   Number of children: Not on file   Years of education: Not on file   Highest education level: GED or equivalent  Occupational History   Not on file  Tobacco Use   Smoking status: Never    Passive exposure: Current   Smokeless tobacco: Never  Vaping Use   Vaping status: Never Used  Substance and Sexual Activity   Alcohol use: Yes    Alcohol/week: 7.0 standard drinks of alcohol    Types: 7 Shots of liquor per week    Comment: socially   Drug use: Yes    Types: Marijuana    Comment: daily   Sexual activity: Yes    Partners: Male    Birth control/protection:  Condom  Other Topics Concern   Not on file  Social History Narrative   Not on file   Social Determinants of Health   Financial Resource Strain: Low Risk  (05/12/2023)   Overall Financial Resource Strain (CARDIA)    Difficulty of Paying Living Expenses: Not hard at all  Food Insecurity: No Food Insecurity (05/12/2023)   Hunger Vital Sign    Worried About Running Out of Food in the Last Year: Never true    Ran Out of Food in the Last Year: Never true  Transportation Needs: No Transportation Needs (05/12/2023)   PRAPARE - Administrator, Civil Service (Medical): No    Lack of Transportation (Non-Medical): No  Physical Activity: Sufficiently Active (05/12/2023)   Exercise Vital Sign    Days of Exercise per Week: 4 days    Minutes of Exercise per Session: 60 min  Stress: No Stress Concern Present (05/12/2023)   [employer] of Occupational Health - Occupational Stress Questionnaire    Feeling of Stress : Not at all  Social Connections: Unknown (05/12/2023)   Social Connection and Isolation Panel [NHANES]    Frequency  of Communication with Friends and Family: More than three times a week    Frequency of Social Gatherings with Friends and Family: More than three times a week    Attends Religious Services: Patient declined    Database administrator or Organizations: No    Attends Banker Meetings: Not on file    Marital Status: Patient declined  Intimate Partner Violence: Not At Risk (04/30/2022)   Humiliation, Afraid, Rape, and Kick questionnaire    Fear of Current or Ex-Partner: No    Emotionally Abused: No    Physically Abused: No    Sexually Abused: No    Review of Systems  All other systems reviewed and are negative.       Objective    BP 108/70   Pulse 79   Temp 98.2 F (36.8 C) (Oral)   Resp 18   Ht 5\' 6"  (1.676 m)   Wt 185 lb 8 oz (84.1 kg)   LMP 04/16/2023 (Exact Date)   SpO2 100%   BMI 29.94 kg/m   Physical Exam Vitals and nursing  note reviewed.  HENT:     Head: Normocephalic and atraumatic.     Right Ear: External ear normal.     Left Ear: External ear normal.     Nose: Nose normal.     Mouth/Throat:     Mouth: Mucous membranes are moist.  Cardiovascular:     Rate and Rhythm: Normal rate.  Pulmonary:     Effort: Pulmonary effort is normal.  Neurological:     General: No focal deficit present.     Mental Status: She is oriented to person, place, and time. Mental status is at baseline.  Psychiatric:        Mood and Affect: Mood normal.        Behavior: Behavior normal.        Thought Content: Thought content normal.        Judgment: Judgment normal.        Assessment & Plan:   Problem List Items Addressed This Visit   None Visit Diagnoses     Encounter to establish care with new doctor    -  Primary   Amenorrhea       Relevant Orders   POCT urine pregnancy (Completed)   hCG, serum, qualitative   Ambulatory referral to Obstetrics / Gynecology   IUP (intrauterine pregnancy), incidental       Relevant Orders   hCG, serum, qualitative   Ambulatory referral to Obstetrics / Gynecology     Positive pregnancy test. Will follow up with serum HCG per request. Referral sent to [facility] per pt's request.   Return if symptoms worsen or fail to improve.   Suzan Slick, MD

## 2023-05-14 LAB — HCG, SERUM, QUALITATIVE: hCG,Beta Subunit,Qual,Serum: POSITIVE m[IU]/mL — AB (ref <6)

## 2023-05-15 ENCOUNTER — Ambulatory Visit: Payer: Medicaid Other

## 2023-05-15 DIAGNOSIS — M542 Cervicalgia: Secondary | ICD-10-CM | POA: Diagnosis not present

## 2023-05-15 DIAGNOSIS — R293 Abnormal posture: Secondary | ICD-10-CM

## 2023-05-15 DIAGNOSIS — R202 Paresthesia of skin: Secondary | ICD-10-CM

## 2023-05-15 LAB — SPECIMEN STATUS REPORT

## 2023-05-15 LAB — BETA HCG QUANT (REF LAB): hCG Quant: 148 m[IU]/mL
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
## 2023-05-19 ENCOUNTER — Ambulatory Visit: Payer: Medicaid Other

## 2023-05-22 ENCOUNTER — Ambulatory Visit: Payer: Medicaid Other

## 2023-06-15 ENCOUNTER — Ambulatory Visit: Payer: Medicaid Other | Attending: Diagnostic Neuroimaging

## 2023-06-15 DIAGNOSIS — R202 Paresthesia of skin: Secondary | ICD-10-CM | POA: Diagnosis present

## 2023-06-15 DIAGNOSIS — M542 Cervicalgia: Secondary | ICD-10-CM | POA: Diagnosis present

## 2023-06-15 DIAGNOSIS — R293 Abnormal posture: Secondary | ICD-10-CM | POA: Insufficient documentation
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
## 2023-06-17 ENCOUNTER — Telehealth: Payer: Self-pay

## 2023-06-17 NOTE — Telephone Encounter (Signed)
Called pt to discuss insurance approving only 1 additional visit. Pt verbalized understanding and agreed that her next appt on 9/25 will be her final appt. An updated HEP will be provided.

## 2023-06-18 ENCOUNTER — Ambulatory Visit: Payer: Medicaid Other

## 2023-06-24 ENCOUNTER — Telehealth: Payer: Self-pay

## 2023-06-24 ENCOUNTER — Ambulatory Visit: Payer: Medicaid Other

## 2023-06-24 NOTE — Telephone Encounter (Signed)
Called pt about missed appt today. LVM regarding next visit 10/02 at 9:45am.

## 2023-07-01 ENCOUNTER — Ambulatory Visit: Payer: Medicaid Other | Attending: Diagnostic Neuroimaging

## 2023-07-07 ENCOUNTER — Ambulatory Visit: Payer: Medicaid Other

## 2023-09-10 ENCOUNTER — Other Ambulatory Visit: Payer: Self-pay | Admitting: Family Medicine

## 2023-09-10 ENCOUNTER — Ambulatory Visit (INDEPENDENT_AMBULATORY_CARE_PROVIDER_SITE_OTHER): Payer: Medicaid Other | Admitting: Family Medicine

## 2023-09-10 ENCOUNTER — Encounter: Payer: Self-pay | Admitting: Family Medicine

## 2023-09-10 VITALS — BP 126/82 | HR 72 | Temp 98.8°F | Resp 16 | Ht 66.0 in | Wt 193.0 lb

## 2023-09-10 DIAGNOSIS — Z118 Encounter for screening for other infectious and parasitic diseases: Secondary | ICD-10-CM | POA: Diagnosis not present

## 2023-09-10 DIAGNOSIS — Z7251 High risk heterosexual behavior: Secondary | ICD-10-CM

## 2023-09-10 DIAGNOSIS — Z202 Contact with and (suspected) exposure to infections with a predominantly sexual mode of transmission: Secondary | ICD-10-CM

## 2023-09-10 DIAGNOSIS — Z1159 Encounter for screening for other viral diseases: Secondary | ICD-10-CM

## 2023-09-10 MED ORDER — AZITHROMYCIN 500 MG PO TABS: 1000.0000 mg | ORAL_TABLET | Freq: Once | ORAL | 0 refills | Status: AC

## 2023-09-10 NOTE — Progress Notes (Signed)
   Acute Office Visit  Subjective:     Patient ID: Carolyn Kramer, female    DOB: Mar 31, 1996, 27 y.o.   MRN: [id number]  Chief Complaint  Patient presents with   Medical Management of Chronic Issues    Std testing, chlamydia treatment, new partner    HPI Patient is in today for acute visit.  Pt is here due to exposure to chlamydia with sexual partner. She would like to to be screened for this along with all other STD screenings today.  Pt reports she has no symptoms.   Review of Systems  All other systems reviewed and are negative.      Objective:    BP 126/82   Pulse 72   Temp 98.8 F (37.1 C) (Oral)   Resp 16   Ht 5\' 6"  (1.676 m)   Wt 193 lb (87.5 kg)   SpO2 100%   BMI 31.15 kg/m  BP Readings from Last 3 Encounters:  09/10/23 126/82  05/13/23 108/70  02/05/23 112/66      Physical Exam Vitals and nursing note reviewed.  Constitutional:      Appearance: Normal appearance. She is normal weight.  HENT:     Head: Normocephalic and atraumatic.     Right Ear: External ear normal.     Left Ear: External ear normal.     Nose: Nose normal.     Mouth/Throat:     Mouth: Mucous membranes are moist.     Pharynx: Oropharynx is clear.  Eyes:     Conjunctiva/sclera: Conjunctivae normal.     Pupils: Pupils are equal, round, and reactive to light.  Cardiovascular:     Rate and Rhythm: Normal rate.  Pulmonary:     Effort: Pulmonary effort is normal.  Skin:    General: Skin is warm.     Capillary Refill: Capillary refill takes less than 2 seconds.  Neurological:     General: No focal deficit present.     Mental Status: She is alert and oriented to person, place, and time. Mental status is at baseline.  Psychiatric:        Mood and Affect: Mood normal.        Behavior: Behavior normal.        Thought Content: Thought content normal.        Judgment: Judgment normal.   No results found for any visits on 09/10/23.      Assessment & Plan:   Problem List  Items Addressed This Visit   None  Exposure to chlamydia  High risk heterosexual behavior -     Chlamydia/Gonococcus/Trichomonas, NAA -     Azithromycin; Take 2 tablets (1,000 mg total) by mouth once for 1 dose.  Dispense: 2 tablet; Refill: 0 -     HepB+HepC+HIV Panel -     RPR  Special screening examination for viral and chlamydial disease -     Chlamydia/Gonococcus/Trichomonas, NAA -     Azithromycin; Take 2 tablets (1,000 mg total) by mouth once for 1 dose.  Dispense: 2 tablet; Refill: 0   Exposure to chlamydia. Will do screening along with empirically treat with Azithromycin 1 gm x 1 dose. To follow up on results.  Counseled on safer sex practices. No orders of the defined types were placed in this encounter.   No follow-ups on file.  Suzan Slick, MD

## 2023-09-11 LAB — RPR: RPR Ser Ql: NONREACTIVE

## 2023-09-11 LAB — HEPB+HEPC+HIV PANEL
HIV Screen 4th Generation wRfx: NONREACTIVE
Hep B C IgM: NEGATIVE
Hep B Core Total Ab: NEGATIVE
Hep B E Ab: NONREACTIVE
Hep B E Ag: NEGATIVE
Hep B Surface Ab, Qual: NONREACTIVE
Hep C Virus Ab: NONREACTIVE
Hepatitis B Surface Ag: NEGATIVE

## 2023-09-12 ENCOUNTER — Encounter: Payer: Self-pay | Admitting: Family Medicine

## 2023-09-13 LAB — CHLAMYDIA/GONOCOCCUS/TRICHOMONAS, NAA
Chlamydia by NAA: NEGATIVE
Gonococcus by NAA: NEGATIVE
Trich vag by NAA: NEGATIVE

## 2023-12-10 ENCOUNTER — Encounter: Payer: Self-pay | Admitting: Family Medicine

## 2023-12-10 ENCOUNTER — Ambulatory Visit (INDEPENDENT_AMBULATORY_CARE_PROVIDER_SITE_OTHER): Payer: Medicaid Other | Admitting: Family Medicine

## 2023-12-10 VITALS — BP 133/84 | HR 75 | Temp 98.1°F | Resp 18 | Ht 66.0 in | Wt 194.0 lb

## 2023-12-10 DIAGNOSIS — R7302 Impaired glucose tolerance (oral): Secondary | ICD-10-CM

## 2023-12-10 DIAGNOSIS — Z13220 Encounter for screening for lipoid disorders: Secondary | ICD-10-CM | POA: Diagnosis not present

## 2023-12-10 DIAGNOSIS — Z118 Encounter for screening for other infectious and parasitic diseases: Secondary | ICD-10-CM

## 2023-12-10 DIAGNOSIS — Z0000 Encounter for general adult medical examination without abnormal findings: Secondary | ICD-10-CM | POA: Diagnosis not present

## 2023-12-10 DIAGNOSIS — Z124 Encounter for screening for malignant neoplasm of cervix: Secondary | ICD-10-CM

## 2023-12-10 DIAGNOSIS — Z1159 Encounter for screening for other viral diseases: Secondary | ICD-10-CM

## 2023-12-10 DIAGNOSIS — Z136 Encounter for screening for cardiovascular disorders: Secondary | ICD-10-CM
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
## 2023-12-11 ENCOUNTER — Encounter: Payer: Self-pay | Admitting: Family Medicine

## 2023-12-11 LAB — HEMOGLOBIN A1C
Est. average glucose Bld gHb Est-mCnc: 117 mg/dL
Hgb A1c MFr Bld: 5.7 % — ABNORMAL HIGH (ref 4.8–5.6)

## 2023-12-11 LAB — COMPREHENSIVE METABOLIC PANEL
ALT: 16 IU/L (ref 0–32)
AST: 17 IU/L (ref 0–40)
Albumin: 4.4 g/dL (ref 4.0–5.0)
Alkaline Phosphatase: 64 IU/L (ref 44–121)
BUN/Creatinine Ratio: 13 (ref 9–23)
BUN: 12 mg/dL (ref 6–20)
Bilirubin Total: 0.3 mg/dL (ref 0.0–1.2)
CO2: 21 mmol/L (ref 20–29)
Calcium: 9.1 mg/dL (ref 8.7–10.2)
Chloride: 104 mmol/L (ref 96–106)
Creatinine, Ser: 0.91 mg/dL (ref 0.57–1.00)
Globulin, Total: 2.7 g/dL (ref 1.5–4.5)
Glucose: 92 mg/dL (ref 70–99)
Potassium: 4.4 mmol/L (ref 3.5–5.2)
Sodium: 138 mmol/L (ref 134–144)
Total Protein: 7.1 g/dL (ref 6.0–8.5)
eGFR: 89 mL/min/{1.73_m2} (ref >59)

## 2023-12-11 LAB — LIPID PANEL
Chol/HDL Ratio: 2.8 ratio (ref 0.0–4.4)
Cholesterol, Total: 167 mg/dL (ref 100–199)
HDL: 60 mg/dL (ref >39)
LDL Chol Calc (NIH): 99 mg/dL (ref 0–99)
Triglycerides: 36 mg/dL (ref 0–149)
VLDL Cholesterol Cal: 8 mg/dL (ref 5–40)

## 2023-12-11 LAB — CBC WITH DIFFERENTIAL/PLATELET
Basophils Absolute: 0.1 10*3/uL (ref 0.0–0.2)
Basos: 1 %
EOS (ABSOLUTE): 0.1 10*3/uL (ref 0.0–0.4)
Eos: 2 %
Hematocrit: 38.4 % (ref 34.0–46.6)
Hemoglobin: 12.2 g/dL (ref 11.1–15.9)
Immature Grans (Abs): 0 10*3/uL (ref 0.0–0.1)
Immature Granulocytes: 0 %
Lymphocytes Absolute: 1.9 10*3/uL (ref 0.7–3.1)
Lymphs: 33 %
MCH: 27.8 pg (ref 26.6–33.0)
MCHC: 31.8 g/dL (ref 31.5–35.7)
MCV: 88 fL (ref 79–97)
Monocytes Absolute: 0.5 10*3/uL (ref 0.1–0.9)
Monocytes: 8 %
Neutrophils Absolute: 3.2 10*3/uL (ref 1.4–7.0)
Neutrophils: 56 %
Platelets: 365 10*3/uL (ref 150–450)
RBC: 4.39 x10E6/uL (ref 3.77–5.28)
RDW: 14.1 % (ref 11.7–15.4)
WBC: 5.8 10*3/uL (ref 3.4–10.8)

## 2023-12-23 ENCOUNTER — Encounter: Payer: Self-pay | Admitting: Family Medicine

## 2023-12-23 LAB — PAP IG AND CT-NG NAA
Chlamydia, Nuc. Acid Amp: NEGATIVE
Gonococcus by Nucleic Acid Amp: NEGATIVE
PAP Smear Comment: 0

## 2024-03-16 ENCOUNTER — Ambulatory Visit: Admitting: Family Medicine

## 2024-03-21 ENCOUNTER — Encounter: Payer: Self-pay | Admitting: Family Medicine

## 2024-03-21 ENCOUNTER — Ambulatory Visit (INDEPENDENT_AMBULATORY_CARE_PROVIDER_SITE_OTHER): Admitting: Family Medicine

## 2024-03-21 VITALS — BP 120/70 | HR 90 | Temp 99.2°F | Resp 18 | Ht 66.0 in | Wt 177.4 lb

## 2024-03-21 DIAGNOSIS — N898 Other specified noninflammatory disorders of vagina: Secondary | ICD-10-CM

## 2024-03-21 DIAGNOSIS — Z118 Encounter for screening for other infectious and parasitic diseases: Secondary | ICD-10-CM

## 2024-03-21 DIAGNOSIS — Z1159 Encounter for screening for other viral diseases: Secondary | ICD-10-CM | POA: Diagnosis not present

## 2024-03-21 NOTE — Progress Notes (Signed)
   Established Patient Office Visit  Subjective   Patient ID: Joeleen Wortley, female    DOB: Nov 13, 1995  Age: 28 y.o. MRN: [id number]  Chief Complaint  Patient presents with   Exposure to STD    Patient is here for STD screening , Patient states that she was not exposed     Exposure to STD   STD screening Pt reported she noticed a bump on her upper labia. She made this appt because of this. It's now gone. She is sexually active.   Review of Systems  Genitourinary:        Vaginal bump that is now resolved  All other systems reviewed and are negative.    Objective:     BP 120/70   Pulse 90   Temp 99.2 F (37.3 C) (Oral)   Resp 18   Ht 5' 6 (1.676 m)   Wt 177 lb 6.4 oz (80.5 kg)   LMP  (Exact Date)   SpO2 98%   BMI 28.63 kg/m  BP Readings from Last 3 Encounters:  03/21/24 120/70  12/10/23 133/84  09/10/23 126/82      Physical Exam Vitals and nursing note reviewed.  Constitutional:      Appearance: Normal appearance. She is normal weight.  HENT:     Head: Normocephalic and atraumatic.     Right Ear: External ear normal.     Left Ear: External ear normal.     Nose: Nose normal.     Mouth/Throat:     Mouth: Mucous membranes are moist.     Pharynx: Oropharynx is clear.   Eyes:     Conjunctiva/sclera: Conjunctivae normal.     Pupils: Pupils are equal, round, and reactive to light.    Cardiovascular:     Rate and Rhythm: Normal rate.  Pulmonary:     Effort: Pulmonary effort is normal.  Abdominal:     General: Bowel sounds are normal.   Skin:    General: Skin is warm.     Capillary Refill: Capillary refill takes less than 2 seconds.   Neurological:     General: No focal deficit present.     Mental Status: She is alert and oriented to person, place, and time. Mental status is at baseline.   Psychiatric:        Mood and Affect: Mood normal.        Behavior: Behavior normal.        Thought Content: Thought content normal.        Judgment:  Judgment normal.    No results found for any visits on 03/21/24.     The ASCVD Risk score (Arnett DK, et al., 2019) failed to calculate for the following reasons:   The 2019 ASCVD risk score is only valid for ages 66 to 68    Assessment & Plan:   Problem List Items Addressed This Visit   None  Vaginal lesion  Special screening examination for viral and chlamydial disease -     Acute Viral Hepatitis (HAV, HBV, HCV) -     HIV Antibody (routine testing w rflx) -     RPR -     Chlamydia/Gonococcus/Trichomonas, NAA   Has hx of vaginal lesion that is now gone. To send for screening STD check with urine and serum.  No follow-ups on file.    Torrence CINDERELLA Barrier, MD

## 2024-03-22 ENCOUNTER — Ambulatory Visit: Payer: Self-pay | Admitting: Family Medicine

## 2024-03-22 LAB — ACUTE VIRAL HEPATITIS (HAV, HBV, HCV)
HCV Ab: NONREACTIVE
Hep A IgM: NEGATIVE
Hep B C IgM: NEGATIVE
Hepatitis B Surface Ag: NEGATIVE

## 2024-03-22 LAB — HIV ANTIBODY (ROUTINE TESTING W REFLEX): HIV Screen 4th Generation wRfx: NONREACTIVE

## 2024-03-22 LAB — RPR: RPR Ser Ql: NONREACTIVE

## 2024-03-22 LAB — HCV INTERPRETATION

## 2024-03-23 LAB — CHLAMYDIA/GONOCOCCUS/TRICHOMONAS, NAA
Chlamydia by NAA: NEGATIVE
Gonococcus by NAA: NEGATIVE
Trich vag by NAA: NEGATIVE

## 2024-05-24 ENCOUNTER — Ambulatory Visit (INDEPENDENT_AMBULATORY_CARE_PROVIDER_SITE_OTHER): Admitting: Family Medicine

## 2024-05-24 ENCOUNTER — Encounter: Payer: Self-pay | Admitting: Family Medicine

## 2024-05-24 VITALS — BP 124/77 | HR 69 | Temp 98.2°F | Resp 18 | Ht 66.0 in | Wt 169.2 lb

## 2024-05-24 DIAGNOSIS — J039 Acute tonsillitis, unspecified: Secondary | ICD-10-CM | POA: Diagnosis not present

## 2024-05-24 MED ORDER — PENICILLIN V POTASSIUM 500 MG PO TABS: 500.0000 mg | ORAL_TABLET | Freq: Two times a day (BID) | ORAL | 0 refills | Status: AC

## 2024-05-24 NOTE — Progress Notes (Signed)
 Acute Office Visit  Subjective:     Patient ID: Carolyn Kramer, female    DOB: 19-Jun-1996, 28 y.o.   MRN: [id number]  Chief Complaint  Patient presents with   Sore Throat    Patient is here due to a persistent sore throat that started  Thursday. Patient was already swabbed and tested negative .     Sore Throat   Patient is in for acute visit.  Pt here for sore throat. Started on last Thursday. She has tried otc medicines, not helping. She went to urgent care on Sunday and had swabs done, negative for flu, covid, and strep. She is here for continued sore throat with subjective fevers. No drooling or neck pain.  Review of Systems  HENT:  Positive for sore throat.   All other systems reviewed and are negative.       Objective:    BP 124/77   Pulse 69   Temp 98.2 F (36.8 C) (Oral)   Resp 18   Ht 5' 6 (1.676 m)   Wt 169 lb 3.2 oz (76.7 kg)   SpO2 99%   BMI 27.31 kg/m  BP Readings from Last 3 Encounters:  05/24/24 124/77  03/21/24 120/70  12/10/23 133/84      Physical Exam Vitals and nursing note reviewed.  Constitutional:      Appearance: Normal appearance. She is normal weight.  HENT:     Head: Normocephalic and atraumatic.     Right Ear: Tympanic membrane, ear canal and external ear normal.     Left Ear: Tympanic membrane, ear canal and external ear normal.     Nose: Nose normal.     Mouth/Throat:     Mouth: Mucous membranes are moist.     Pharynx: Oropharynx is clear.     Comments: Tonsil hypertrophy bilaterally with erythema Eyes:     Conjunctiva/sclera: Conjunctivae normal.     Pupils: Pupils are equal, round, and reactive to light.  Cardiovascular:     Rate and Rhythm: Normal rate and regular rhythm.     Pulses: Normal pulses.     Heart sounds: Normal heart sounds.  Pulmonary:     Effort: Pulmonary effort is normal.     Breath sounds: Normal breath sounds.  Skin:    General: Skin is warm.     Capillary Refill: Capillary refill takes  less than 2 seconds.  Neurological:     General: No focal deficit present.     Mental Status: She is alert and oriented to person, place, and time. Mental status is at baseline.  Psychiatric:        Mood and Affect: Mood normal.        Behavior: Behavior normal.        Thought Content: Thought content normal.        Judgment: Judgment normal.    No results found for any visits on 05/24/24.      Assessment & Plan:   Problem List Items Addressed This Visit   None Visit Diagnoses       Tonsillitis    -  Primary   Relevant Medications   penicillin  v potassium (VEETID) 500 MG tablet       Meds ordered this encounter  Medications   penicillin  v potassium (VEETID) 500 MG tablet    Sig: Take 1 tablet (500 mg total) by mouth 2 (two) times daily for 5 days.    Dispense:  10 tablet    Refill:  0  Pt with tonsillitis. Treat with PCN 500mg  BID x 5 days.  Advised to do warm salt water gargles, honey/tea, cepacol lozenges, and ibuprofen/tylenol prn for pain.  No follow-ups on file.  Torrence CINDERELLA Barrier, MD

## 2024-05-25 ENCOUNTER — Other Ambulatory Visit: Payer: Self-pay

## 2024-05-25 ENCOUNTER — Encounter: Payer: Self-pay | Admitting: Family Medicine

## 2024-05-25 ENCOUNTER — Emergency Department (HOSPITAL_BASED_OUTPATIENT_CLINIC_OR_DEPARTMENT_OTHER)
Admission: EM | Admit: 2024-05-25 | Discharge: 2024-05-25 | Disposition: A | Discharge status: Home / Self Care | Attending: Emergency Medicine | Admitting: Emergency Medicine

## 2024-05-25 DIAGNOSIS — J029 Acute pharyngitis, unspecified: Secondary | ICD-10-CM | POA: Diagnosis present

## 2024-05-25 DIAGNOSIS — J039 Acute tonsillitis, unspecified: Secondary | ICD-10-CM

## 2024-05-25 LAB — CBC WITH DIFFERENTIAL/PLATELET
Abs Immature Granulocytes: 0.02 K/uL (ref 0.00–0.07)
Basophils Absolute: 0.1 K/uL (ref 0.0–0.1)
Basophils Relative: 1 %
Eosinophils Absolute: 0.1 K/uL (ref 0.0–0.5)
Eosinophils Relative: 2 %
HCT: 38.3 % (ref 36.0–46.0)
Hemoglobin: 12.4 g/dL (ref 12.0–15.0)
Immature Granulocytes: 0 %
Lymphocytes Relative: 33 %
Lymphs Abs: 2.2 K/uL (ref 0.7–4.0)
MCH: 27.7 pg (ref 26.0–34.0)
MCHC: 32.4 g/dL (ref 30.0–36.0)
MCV: 85.7 fL (ref 80.0–100.0)
Monocytes Absolute: 0.5 K/uL (ref 0.1–1.0)
Monocytes Relative: 7 %
Neutro Abs: 3.7 K/uL (ref 1.7–7.7)
Neutrophils Relative %: 57 %
Platelets: 390 K/uL (ref 150–400)
RBC: 4.47 MIL/uL (ref 3.87–5.11)
RDW: 14.2 % (ref 11.5–15.5)
WBC: 6.6 K/uL (ref 4.0–10.5)
nRBC: 0 % (ref 0.0–0.2)

## 2024-05-25 LAB — COMPREHENSIVE METABOLIC PANEL WITH GFR
ALT: 9 U/L (ref 0–44)
AST: 17 U/L (ref 15–41)
Albumin: 4.5 g/dL (ref 3.5–5.0)
Alkaline Phosphatase: 73 U/L (ref 38–126)
Anion gap: 14 (ref 5–15)
BUN: 10 mg/dL (ref 6–20)
CO2: 20 mmol/L — ABNORMAL LOW (ref 22–32)
Calcium: 9.6 mg/dL (ref 8.9–10.3)
Chloride: 107 mmol/L (ref 98–111)
Creatinine, Ser: 0.91 mg/dL (ref 0.44–1.00)
GFR, Estimated: >60 mL/min (ref >60)
Glucose, Bld: 100 mg/dL — ABNORMAL HIGH (ref 70–99)
Potassium: 3.7 mmol/L (ref 3.5–5.1)
Sodium: 140 mmol/L (ref 135–145)
Total Bilirubin: 0.3 mg/dL (ref 0.0–1.2)
Total Protein: 7.7 g/dL (ref 6.5–8.1)

## 2024-05-25 LAB — MONONUCLEOSIS SCREEN: Mono Screen: NEGATIVE

## 2024-05-25 MED ORDER — SODIUM CHLORIDE 0.9 % IV BOLUS
500.0000 mL | Freq: Once | INTRAVENOUS | Status: AC
  Administered 2024-05-25: 500 mL via INTRAVENOUS

## 2024-05-25 MED ORDER — METHYLPREDNISOLONE 4 MG PO TBPK: ORAL_TABLET | ORAL | 0 refills | Status: DC

## 2024-05-25 MED ORDER — METHYLPREDNISOLONE 4 MG PO TBPK: ORAL_TABLET | ORAL | 0 refills | Status: AC

## 2024-05-25 MED ORDER — DEXAMETHASONE SODIUM PHOSPHATE 10 MG/ML IJ SOLN
10.0000 mg | Freq: Once | INTRAMUSCULAR | Status: AC
  Administered 2024-05-25: 10 mg via INTRAVENOUS
  Filled 2024-05-25: qty 1

## 2024-05-25 MED ORDER — KETOROLAC TROMETHAMINE 30 MG/ML IJ SOLN
30.0000 mg | Freq: Once | INTRAMUSCULAR | Status: AC
  Administered 2024-05-25: 30 mg via INTRAVENOUS
  Filled 2024-05-25: qty 1

## 2024-05-25 MED ORDER — NAPROXEN 375 MG PO TABS: 375.0000 mg | ORAL_TABLET | Freq: Two times a day (BID) | ORAL | 0 refills | Status: AC

## 2024-05-25 MED ORDER — NAPROXEN 375 MG PO TABS: 375.0000 mg | ORAL_TABLET | Freq: Two times a day (BID) | ORAL | 0 refills | Status: DC

## 2024-05-25 NOTE — ED Notes (Signed)
 ED Provider at bedside.

## 2024-05-25 NOTE — Discharge Instructions (Addendum)
 ### Sore Throat ER Visit     You came to the emergency room because of a sore throat. Here is important information about your visit and what to expect next:      **What was done in the ER:**      - You were already taking penicillin , which is an antibiotic.      - Tests for strep throat (a common bacterial cause) and COVID-19 were both negative. This means your sore throat is not caused by these infections.      - You were given Decadron  (a steroid) and Toradol  (a pain reliever) in the ER to help with pain and swelling. Steroids like Decadron  can help reduce throat pain and speed up recovery in some cases, but the benefit is usually modest and short-term.[1][2][3][4]      - You were sent home with a Medrol  taper (another steroid) and naproxen  (a pain reliever similar to ibuprofen). These medicines help reduce pain and inflammation.      **What could be causing your sore throat:**      - Most sore throats are caused by viruses, not bacteria, and do not need antibiotics.[5][6][4]      - Because your strep test was negative and you are already on penicillin , a bacterial infection is unlikely.    - Your Mono test was negative!  **What to expect:**      - Sore throats from viruses usually get better on their own in about a week.      - Steroids and pain relievers can help you feel better faster, but they do not cure the infection.[1][2][3][4]      - If you have mono, antibiotics like penicillin  do not help, and sometimes can cause a rash.[7]      **How to care for yourself at home:**      - Take your medicines as prescribed.      - Rest and drink plenty of fluids.      - Use over-the-counter pain relievers (like naproxen  or acetaminophen) as needed for pain.      - Throat lozenges, warm salt water gargles, and cool drinks may also help soothe your throat.[5][4]      **When to return to the ER or call your doctor:**      - If you have trouble breathing or swallowing      - If  you cannot drink enough fluids or are getting dehydrated      - If you develop a high fever that does not go down with medicine      - If you have severe neck swelling, drooling, or cannot open your mouth      - If you develop a new rash, especially if you are taking antibiotics      **Follow-up:**      - Check your medical chart for the Monospot test result. Your doctor will contact you if any changes to your treatment are needed.      - If your symptoms are not improving after a week, or if you have any concerns, contact your doctor.      **Remember:** Most sore throats are not serious and get better with time and supportive care. The medicines you received are to help with pain and swelling while your body heals.[1][2][5][3][4]      If you have any questions or new symptoms, please reach out to your healthcare provider.      ### References  1. Corticosteroids as Standalone  or Add-on Treatment for Sore Throat. de Harlen GORMAN Hummer MJ, Maximiano SAUNDERS, et al. The Cochrane Database of Systematic Reviews. 2020;5:CD008268. doi:10.1002/14651858.RI991731.ela6. 2. Effect of Oral Dexamethasone  Without Immediate Antibiotics vs Placebo on Acute Sore Throat in Adults: A Randomized Clinical Trial. Ltanya KHAN, Hay AD, Moore MV, et al. JAMA. 2017;317(15):1535-1543. doi:10.1001/jama.7982.6582. 3. Clinical Practice Guideline for the Diagnosis and Management of Group a Streptococcal Pharyngitis: 2012 Update by the Infectious Diseases Society of [place]. Shulman ST, Bisno AL, Clegg HW, et al. Clinical Infectious Diseases : An Official Publication of the Infectious Diseases Society of [place]. 2012;55(10):e86-102. doi:10.1093/cid/cis629. 4. Common Questions About Streptococcal Pharyngitis. Kalra MG, Zettie LANEY Burnet ED. American Family Physician. 2016;94(1):24-31. 5. Appropriate Antibiotic Use for Acute Respiratory Tract Infection in Adults: Advice for High-Value Care From the [employer] of Physicians and  the Centers for Disease Control and Prevention. Chivonne Rascon AM, Vinie MAYERS, Qaseem A. Annals of Internal Medicine. 2016;164(6):425-34. doi:10.7326/M15-1840. 6. Guide to Utilization of the Microbiology Laboratory for Diagnosis of Infectious Diseases: 2024 Update by the Infectious Diseases Society of America (IDSA) and the [employer] for Microbiology (ASM). Cleotilde COVER, Binnicker MJ, Elaine GORMAN, et al. Clinical Infectious Diseases : An Official Publication of the Infectious Diseases Society of [place]. 2024;:ciae104. doi:10.1093/cid/ciae104. 7. Does This Patient Have Infectious Mononucleosis?The Rational Clinical Examination Systematic Review. Ebell MH, Call M, Ladena JINNY Lonzell DOROTHA JAMA. 2016;315(14):1502-9. doi:10.1001/jama.2016.2111.

## 2024-05-25 NOTE — ED Provider Notes (Signed)
 [place] EMERGENCY DEPARTMENT AT [facility] Provider Note   CSN: [id number] Arrival date & time: 05/25/24  8251     Patient presents with: Sore Throat   Carolyn Kramer is a 28 y.o. female.   28 y/o F with  sore throat R> L onset last Thursday, days ago.  Patient reports pain with swallowing.  She had negative strep and COVID in the outpatient setting and is currently on penicillin  reports no improvement over the past 2 days she was concerned she could have a peritonsillar abscess since she continues have pain on the right side.  Pain also is worsened with swallowing, pain radiates to her ear.  She denies severe headache, fevers, inability to tolerate her own secretions, change in phonation.  The history is provided by the patient and medical records.  Sore Throat This is a new problem. Episode onset: 6 days. The problem occurs constantly. The problem has not changed since onset.Pertinent negatives include no chest pain, no abdominal pain and no shortness of breath. The symptoms are aggravated by swallowing. Nothing relieves the symptoms. Treatments tried: pcn. The treatment provided no relief.       Prior to Admission medications   Medication Sig Start Date End Date Taking? Authorizing Provider  penicillin  v potassium (VEETID) 500 MG tablet Take 1 tablet (500 mg total) by mouth 2 (two) times daily for 5 days. 05/24/24 05/29/24  Colette Torrence GRADE, MD    Allergies: Patient has no known allergies.    Review of Systems  Respiratory:  Negative for shortness of breath.   Cardiovascular:  Negative for chest pain.  Gastrointestinal:  Negative for abdominal pain.    Updated Vital Signs BP (!) 150/103 (BP Location: Right Arm)   Pulse 76   Temp 98.7 F (37.1 C)   Resp 18   Ht 5' 6 (1.676 m)   Wt 74.8 kg   LMP 04/24/2024 (Approximate)   SpO2 96%   BMI 26.63 kg/m   Physical Exam Vitals and nursing note reviewed.  Constitutional:      General: She is not in  acute distress.    Appearance: She is well-developed. She is not diaphoretic.  HENT:     Head: Normocephalic and atraumatic.     Right Ear: Tympanic membrane and external ear normal.     Left Ear: Tympanic membrane and external ear normal.     Nose: Nose normal.     Mouth/Throat:     Mouth: Mucous membranes are moist. No oral lesions.     Pharynx: Uvula midline. Posterior oropharyngeal erythema present. No pharyngeal swelling, oropharyngeal exudate or uvula swelling.     Tonsils: No tonsillar exudate or tonsillar abscesses. 1+ on the right. 1+ on the left.  Eyes:     General: No scleral icterus.    Conjunctiva/sclera: Conjunctivae normal.  Cardiovascular:     Rate and Rhythm: Normal rate and regular rhythm.     Heart sounds: Normal heart sounds. No murmur heard.    No friction rub. No gallop.  Pulmonary:     Effort: Pulmonary effort is normal. No respiratory distress.     Breath sounds: Normal breath sounds.  Abdominal:     General: Bowel sounds are normal. There is no distension.     Palpations: Abdomen is soft. There is no mass.     Tenderness: There is no abdominal tenderness. There is no guarding.  Musculoskeletal:     Cervical back: Normal range of motion.  Lymphadenopathy:  Cervical: Cervical adenopathy present.  Skin:    General: Skin is warm and dry.  Neurological:     Mental Status: She is alert and oriented to person, place, and time.  Psychiatric:        Behavior: Behavior normal.     (all labs ordered are listed, but only abnormal results are displayed) Labs Reviewed  COMPREHENSIVE METABOLIC PANEL WITH GFR - Abnormal; Notable for the following components:      Result Value   CO2 20 (*)    Glucose, Bld 100 (*)    All other components within normal limits  CBC WITH DIFFERENTIAL/PLATELET  MONONUCLEOSIS SCREEN    EKG: None  Radiology: No results found.   Procedures   Medications Ordered in the ED  ketorolac  (TORADOL ) 30 MG/ML injection 30 mg (has  no administration in time range)  dexamethasone  (DECADRON ) injection 10 mg (has no administration in time range)  sodium chloride  0.9 % bolus 500 mL (has no administration in time range)                                    Medical Decision Making Amount and/or Complexity of Data Reviewed Labs: ordered.  Risk Prescription drug management.   28 year old female who presents emergency department with chief complaint of sore throat.  Clinically the patient does not have any evidence of peritonsillar abscess.  She was given Toradol  and Decadron  in the emergency department significant proving in her symptoms.  I ordered a Monospot test which is negative for any abnormality.  At this point patient is afebrile without abnormal phonation fullness of the neck or hypoglossal region tolerating her own oral secretions and appears appropriate for discharge with symptomatic treatment.     Final diagnoses:  None    ED Discharge Orders     None          Arloa Chroman, PA-C 05/26/24 2336    Carolyn Longs, MD 06/01/24 [phone]

## 2024-05-25 NOTE — ED Triage Notes (Signed)
 Pt POV reporting persistent sore throat r/t tonsillitis, taking abx x2 days with no improvement. Neg covid flu and strep.

## 2024-07-06 ENCOUNTER — Ambulatory Visit

## 2024-07-06 ENCOUNTER — Ambulatory Visit
Admission: RE | Admit: 2024-07-06 | Discharge: 2024-07-06 | Disposition: A | Discharge status: Home / Self Care | Attending: Emergency Medicine | Admitting: Emergency Medicine

## 2024-07-06 VITALS — BP 134/80 | HR 72 | Temp 98.3°F | Resp 18

## 2024-07-06 DIAGNOSIS — M79672 Pain in left foot: Secondary | ICD-10-CM

## 2024-07-06 DIAGNOSIS — M25572 Pain in left ankle and joints of left foot: Secondary | ICD-10-CM
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
## 2024-07-06 NOTE — ED Triage Notes (Signed)
 Patient to Urgent Care with complaints of left sided ankle pain that started yesterday.  States she stepped down wrong and landed on the outside of her ankle.

## 2024-07-06 NOTE — Discharge Instructions (Addendum)
 Wear the walking boot as directed.  Take ibuprofen as directed.  Rest and elevate your foot and ankle as directed.  Apply ice packs as directed.  Follow-up with a podiatrist such as the one listed below.

## 2024-09-28 ENCOUNTER — Ambulatory Visit

## 2024-09-28 DIAGNOSIS — Z113 Encounter for screening for infections with a predominantly sexual mode of transmission: Secondary | ICD-10-CM

## 2024-09-28 LAB — WET PREP FOR TRICH, YEAST, CLUE
Clue Cell Exam: NEGATIVE
Trichomonas Exam: NEGATIVE
Yeast Exam: NEGATIVE

## 2024-09-28 LAB — HM HIV SCREENING LAB: HM HIV Screening: NEGATIVE

## 2024-09-28 NOTE — Progress Notes (Signed)
 Wet prep results reviewed with pt, no treatment required per SO. Larraine JONELLE Novak, RN
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
## 2024-10-03 LAB — GONOCOCCUS CULTURE

## 2024-10-28 ENCOUNTER — Telehealth: Payer: Self-pay | Admitting: Family Medicine

## 2024-12-12 ENCOUNTER — Encounter: Admitting: Family Medicine
